# Patient Record
Sex: Male | Born: 2000
Health system: Southern US, Community
[De-identification: ages and names within clinical notes are randomized; demographics above are authoritative.]

## PROBLEM LIST (undated history)

## (undated) DIAGNOSIS — Q677 Pectus carinatum: Secondary | ICD-10-CM

## (undated) DIAGNOSIS — Z8781 Personal history of (healed) traumatic fracture: Secondary | ICD-10-CM

## (undated) DIAGNOSIS — R2241 Localized swelling, mass and lump, right lower limb: Secondary | ICD-10-CM

---
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
## 2021-02-18 IMAGING — US US EXTREM LOW*R* LIMITED
1 series · 14 of 18 positions shown · non-contrast
Comparison: None.

CLINICAL DATA: Palpable lump right calf for 1 year

EXAM:
ULTRASOUND RIGHT LOWER EXTREMITY LIMITED
TECHNIQUE: Ultrasound examination of the lower extremity soft tissues was
performed in the area of clinical concern.

[Series 1: us extrem low*right* limited · 0.05mm/px · 18 acquisitions, 14 frames shown]
[im 1/18]
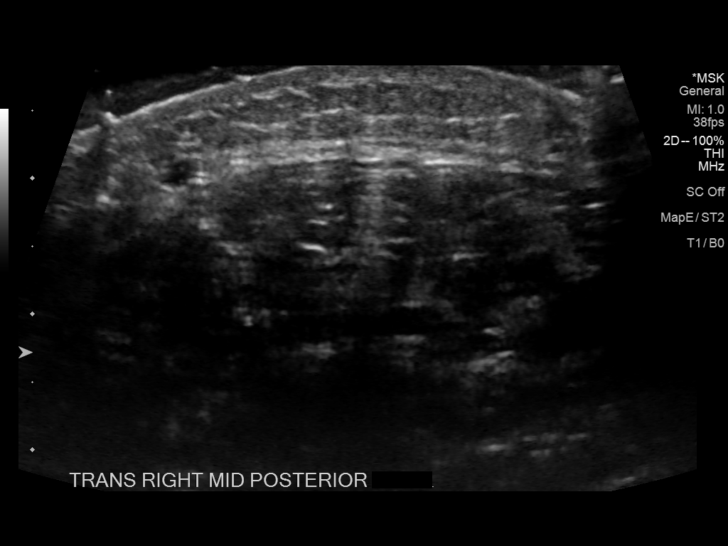
[im 2/18]
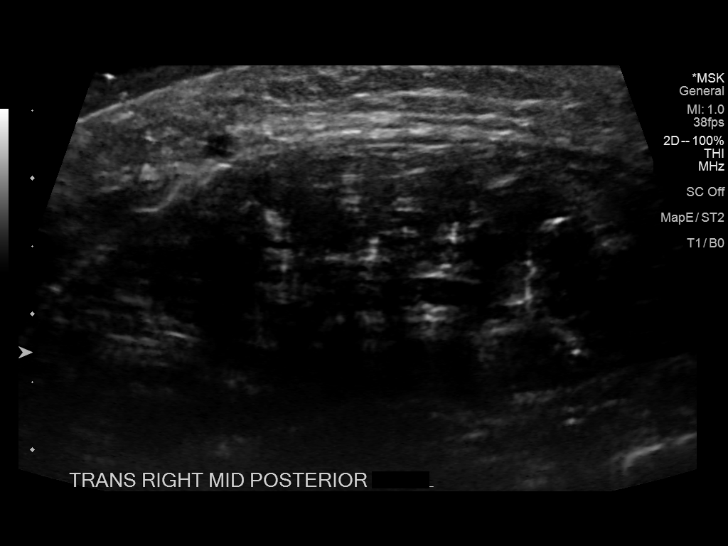
[im 4/18]
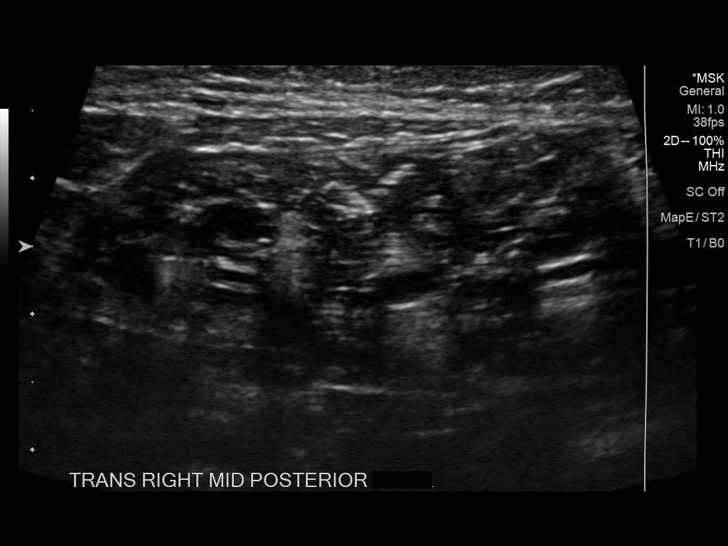
[im 5/18]
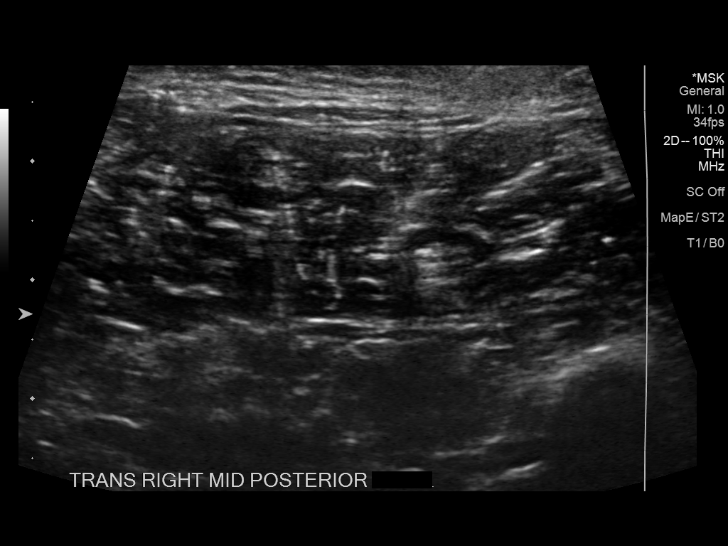
[im 6/18]
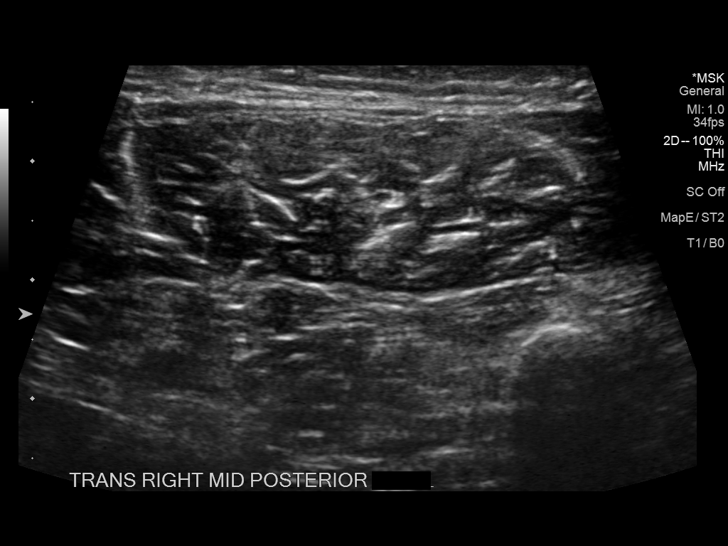
[im 8/18]
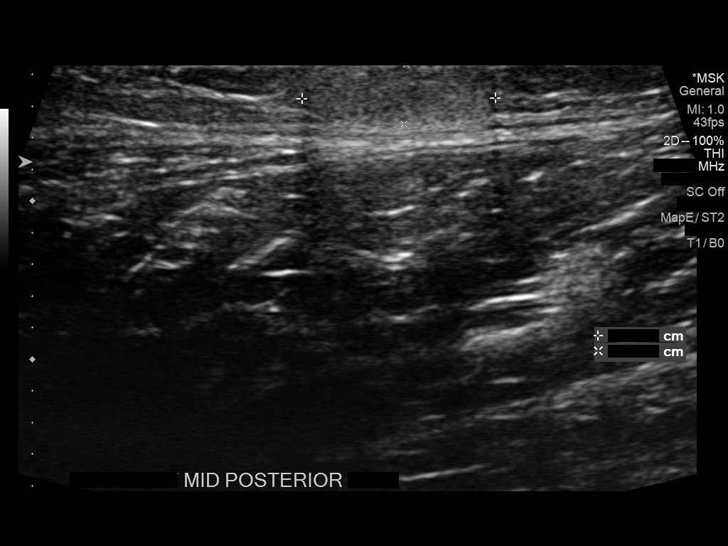
[im 9/18]
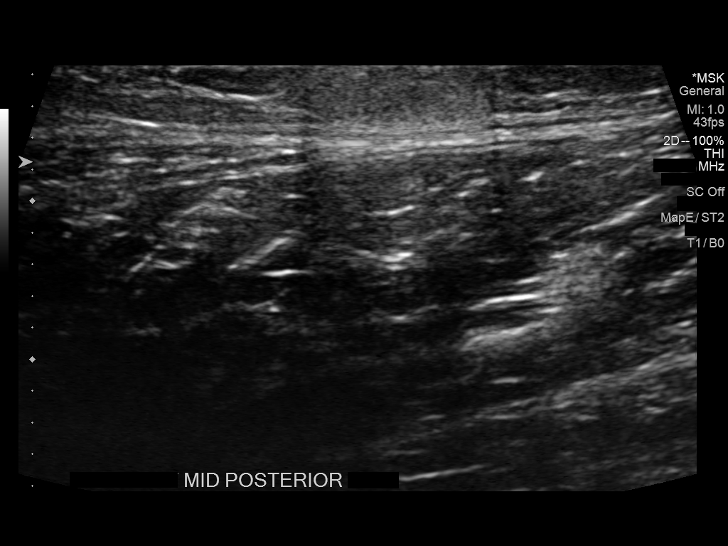
[im 10/18]
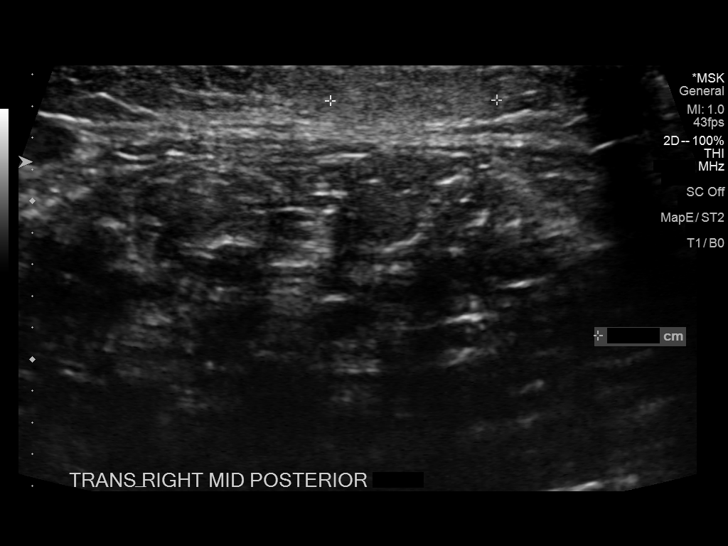
[im 11/18]
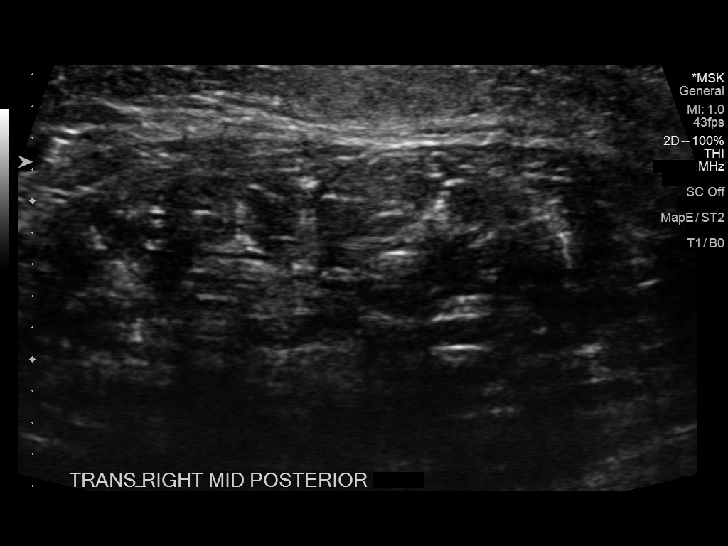
[im 13/18]
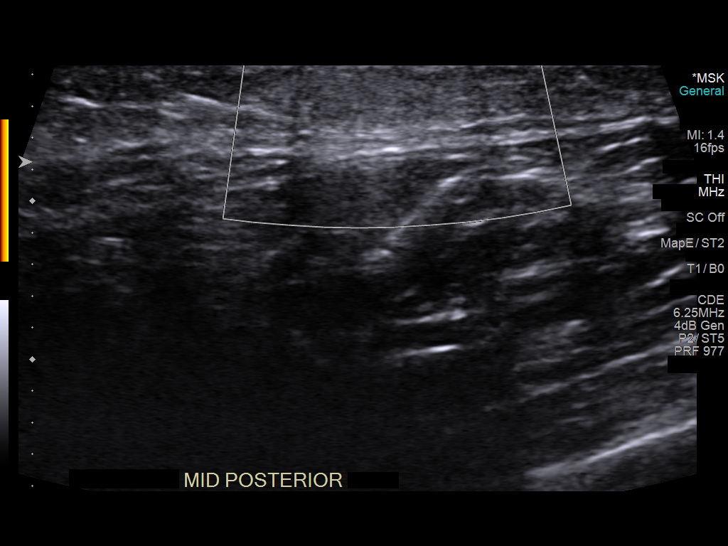
[im 14/18]
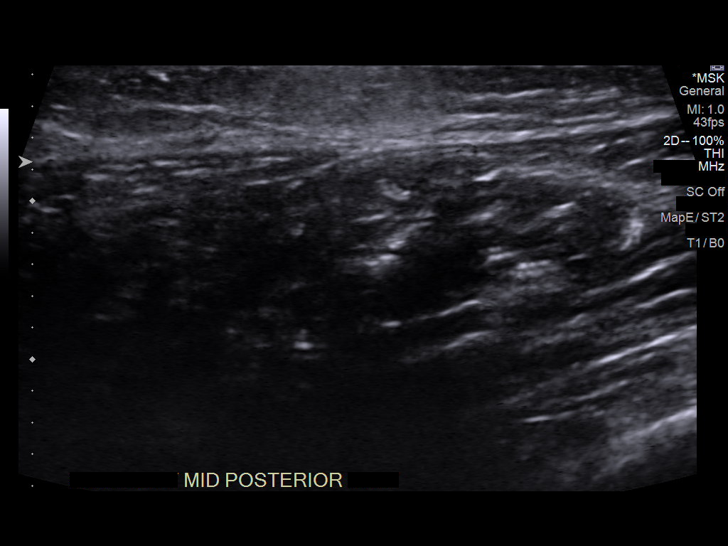
[im 15/18]
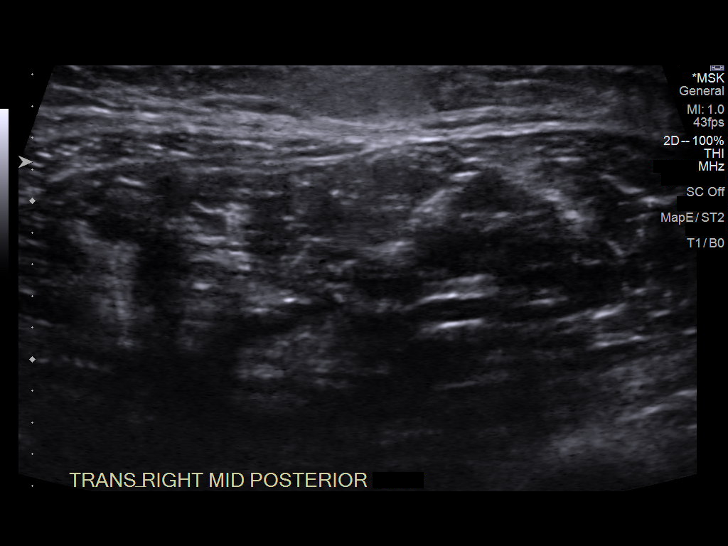
[im 17/18]
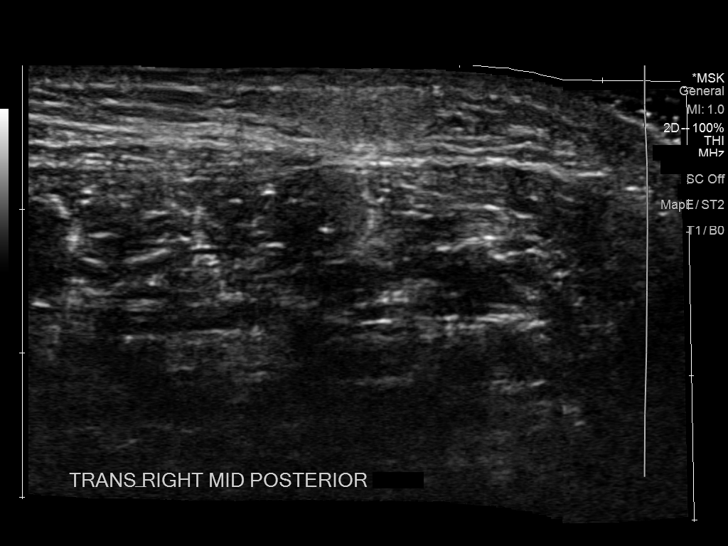
[im 18/18]
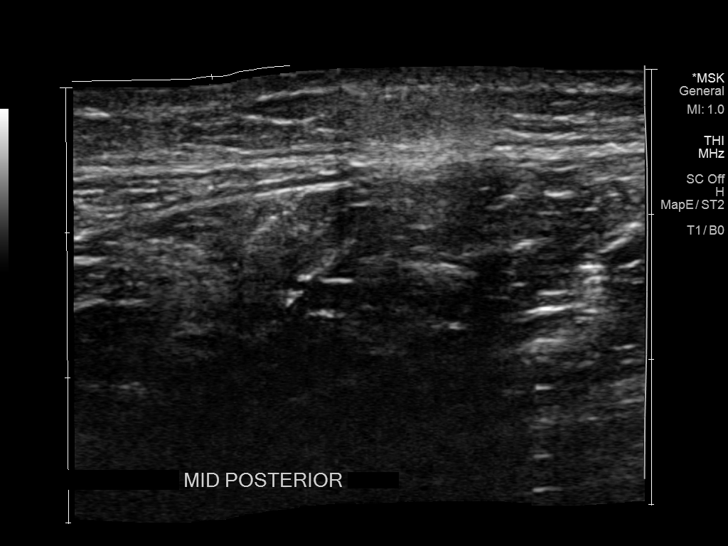

[14 of 18 positions shown; findings below may reference images not displayed]

FINDINGS: Subtle area noted within the subcutaneous soft tissues which is
isoechoic with the subcutaneous fat. Borders are difficult to
visualize but area approximately 3.4 by 1.1 x 1.0 cm. Favor lipoma.
IMPRESSION: Subtle isoechoic area within the subcutaneous soft tissues in the
right calf, favor lipoma. This could be followed clinically to
ensure stability.

## 2021-05-18 ENCOUNTER — Ambulatory Visit (INDEPENDENT_AMBULATORY_CARE_PROVIDER_SITE_OTHER): Payer: 59 | Admitting: Podiatry

## 2021-05-18 ENCOUNTER — Encounter: Payer: Self-pay | Admitting: Podiatry

## 2021-05-18 ENCOUNTER — Other Ambulatory Visit: Payer: Self-pay

## 2021-05-18 ENCOUNTER — Telehealth: Payer: Self-pay | Admitting: *Deleted

## 2021-05-18 DIAGNOSIS — B070 Plantar wart: Secondary | ICD-10-CM

## 2021-05-18 MED ORDER — FLUOROURACIL 5 % EX CREA: TOPICAL_CREAM | Freq: Two times a day (BID) | CUTANEOUS | 2 refills | Status: AC

## 2021-05-18 NOTE — Telephone Encounter (Signed)
Patient is calling and wanting to know if he can take a shower or go swimming , wart was only medicated today. Please advise.

## 2021-05-19 NOTE — Telephone Encounter (Signed)
That will be fine later today

## 2021-05-19 NOTE — Telephone Encounter (Signed)
Returned the call to patient, no answer, left vmessage of Dr Mellody Drown recommendations.

## 2021-05-20 NOTE — Progress Notes (Signed)
Subjective:   Patient ID: Kurt Lopez, male   DOB: 20 y.o.   MRN: [id number]   HPI Patient states that he has developed a lesion on the bottom of his right foot on the inside that is been very sore and has been going on now for several months.  He is try to walk on it and it gets tender when he does and he has tried over-the-counter medicine without relief.   Review of Systems  All other systems reviewed and are negative.      Objective:  Physical Exam Vitals and nursing note reviewed.  Constitutional:      Appearance: He is well-developed.  Pulmonary:     Effort: Pulmonary effort is normal.  Musculoskeletal:        General: Normal range of motion.  Skin:    General: Skin is warm.  Neurological:     Mental Status: He is alert.    Neurovascular status was found to be intact muscle strength is found to be adequate range of motion adequate.  Patient is found to have lesion on the plantar aspect right arch towards the heel measuring about 7 x 7 mm with 2 small lesions that are new that have popped up next to it.  They are painful when pressed mostly to lateral pressure and there is pinpoint bleeding upon debridement     Assessment:  Strong probability for verruca plantaris plantar aspect right active phase     Plan:  H&P reviewed condition Sharp sterile debridement accomplished and applied chemical agent to create immune response with sterile dressings and instructed what to do if any blistering were to occur.  I went ahead today and I did also place him on Efudex cream to try to help improve the condition and discussed possible excision if it remains a problem

## 2021-07-03 ENCOUNTER — Encounter: Payer: Self-pay | Admitting: Podiatry

## 2021-07-03 ENCOUNTER — Ambulatory Visit: Payer: 59 | Admitting: Podiatry

## 2021-07-03 ENCOUNTER — Other Ambulatory Visit: Payer: Self-pay

## 2021-07-03 DIAGNOSIS — B070 Plantar wart: Secondary | ICD-10-CM | POA: Diagnosis not present

## 2021-07-04 NOTE — Progress Notes (Signed)
Subjective:   Patient ID: Kurt Lopez, male   DOB: 21 y.o.   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  Patient wants surgery as does his mother and they understand the risk of procedure reoccurrence and infection and everything else as listed.  Today I injected the area 60 mg lidocaine with epinephrine sterile prep done and using sterile instrumentation I remove the mass patient tolerated procedure well I applied phenol to the base I did send this off for pathological evaluation and advised on elevation compression and that this will take several weeks to heal and if I see anything on pathology I will call him and I did advise on review as needed
